# Patient Record
Sex: Male | Born: 1955 | Race: White | Hispanic: No | Smoking: Former smoker
Health system: Southern US, Community
[De-identification: ages and names within clinical notes are randomized; demographics above are authoritative.]

## PROBLEM LIST (undated history)

## (undated) DIAGNOSIS — E119 Type 2 diabetes mellitus without complications: Secondary | ICD-10-CM

## (undated) DIAGNOSIS — G4730 Sleep apnea, unspecified: Secondary | ICD-10-CM

## (undated) DIAGNOSIS — I10 Essential (primary) hypertension: Secondary | ICD-10-CM

## (undated) DIAGNOSIS — N2889 Other specified disorders of kidney and ureter: Secondary | ICD-10-CM

## (undated) HISTORY — PX: NO PAST SURGERIES: SHX2092

---

## 2015-11-28 ENCOUNTER — Other Ambulatory Visit: Payer: Self-pay | Admitting: Urology

## 2016-01-03 ENCOUNTER — Encounter (HOSPITAL_COMMUNITY): Payer: Self-pay

## 2016-01-03 ENCOUNTER — Encounter (HOSPITAL_COMMUNITY): Payer: Self-pay | Admitting: *Deleted

## 2016-01-03 ENCOUNTER — Encounter (HOSPITAL_COMMUNITY)
Admission: RE | Admit: 2016-01-03 | Discharge: 2016-01-03 | Disposition: A | Discharge status: Home / Self Care | Payer: Commercial Managed Care - PPO | Source: Ambulatory Visit | Attending: Urology | Admitting: Urology

## 2016-01-03 DIAGNOSIS — E119 Type 2 diabetes mellitus without complications: Secondary | ICD-10-CM | POA: Diagnosis not present

## 2016-01-03 DIAGNOSIS — Z791 Long term (current) use of non-steroidal anti-inflammatories (NSAID): Secondary | ICD-10-CM | POA: Diagnosis not present

## 2016-01-03 DIAGNOSIS — Z79899 Other long term (current) drug therapy: Secondary | ICD-10-CM | POA: Diagnosis not present

## 2016-01-03 DIAGNOSIS — G4730 Sleep apnea, unspecified: Secondary | ICD-10-CM | POA: Diagnosis not present

## 2016-01-03 DIAGNOSIS — Z87891 Personal history of nicotine dependence: Secondary | ICD-10-CM | POA: Diagnosis not present

## 2016-01-03 DIAGNOSIS — I10 Essential (primary) hypertension: Secondary | ICD-10-CM | POA: Diagnosis not present

## 2016-01-03 DIAGNOSIS — C642 Malignant neoplasm of left kidney, except renal pelvis: Secondary | ICD-10-CM | POA: Diagnosis present

## 2016-01-03 DIAGNOSIS — Z7982 Long term (current) use of aspirin: Secondary | ICD-10-CM | POA: Diagnosis not present

## 2016-01-03 HISTORY — DX: Other specified disorders of kidney and ureter: N28.89

## 2016-01-03 HISTORY — DX: Type 2 diabetes mellitus without complications: E11.9

## 2016-01-03 LAB — BASIC METABOLIC PANEL
Anion gap: 7 (ref 5–15)
BUN: 15 mg/dL (ref 6–20)
CALCIUM: 9.2 mg/dL (ref 8.9–10.3)
CO2: 28 mmol/L (ref 22–32)
Chloride: 105 mmol/L (ref 101–111)
Creatinine, Ser: 0.88 mg/dL (ref 0.61–1.24)
GFR calc Af Amer: >60 mL/min (ref >60)
GFR calc non Af Amer: >60 mL/min (ref >60)
GLUCOSE: 131 mg/dL — AB (ref 65–99)
Potassium: 3.5 mmol/L (ref 3.5–5.1)
Sodium: 140 mmol/L (ref 135–145)

## 2016-01-03 LAB — CBC
HEMATOCRIT: 44.4 % (ref 39.0–52.0)
Hemoglobin: 15.4 g/dL (ref 13.0–17.0)
MCH: 30.7 pg (ref 26.0–34.0)
MCHC: 34.7 g/dL (ref 30.0–36.0)
MCV: 88.6 fL (ref 78.0–100.0)
Platelets: 298 10*3/uL (ref 150–400)
RBC: 5.01 MIL/uL (ref 4.22–5.81)
RDW: 13.8 % (ref 11.5–15.5)
WBC: 9.3 10*3/uL (ref 4.0–10.5)

## 2016-01-03 NOTE — Patient Instructions (Signed)
Gavin Miller  01/03/2016   Your procedure is scheduled on: 01/06/16  Report to [facility]  Entrance take [facility]  elevators to 3rd floor to  [place] at 5:30 AM.  Call this number if you have problems the morning of surgery [phone]   Remember: ONLY 1 PERSON MAY GO WITH YOU TO SHORT STAY TO GET  READY MORNING OF Gavin Miller.  Do not eat food or drink liquids :After Midnight Thursday     Take these medicines the morning of surgery with A SIP OF WATER: none                               You may not have any metal on your body including and              piercings.  Do not wear jewelry, lotions, powders.                        Men may shave face and neck.   Do not bring valuables to the hospital. [place].  Contacts, dentures or bridgework may not be worn into surgery.  Leave suitcase in the car. After surgery it may be brought to your room.     _____________________________________________________________________             [facility] - Preparing for Surgery Before surgery, you can play an important role.  Because skin is not sterile, your skin needs to be as free of germs as possible.  You can reduce the number of germs on your skin by washing with CHG (chlorahexidine gluconate) soap before surgery.  CHG is an antiseptic cleaner which kills germs and bonds with the skin to continue killing germs even after washing. Please DO NOT use if you have an allergy to CHG or antibacterial soaps.  If your skin becomes reddened/irritated stop using the CHG and inform your nurse when you arrive at Short Stay. Do not shave (including legs and underarms) for at least 48 hours prior to the first CHG shower.  You may shave your face/neck. Please follow these instructions carefully:  1.  Shower with CHG Soap the night before surgery and the  morning of Surgery.  2.  If you choose to wash your hair, wash  your hair first as usual with your  normal  shampoo.  3.  After you shampoo, rinse your hair and body thoroughly to remove the  shampoo.                           4.  Use CHG as you would any other liquid soap.  You can apply chg directly  to the skin and wash                       Gently with a scrungie or clean washcloth.  5.  Apply the CHG Soap to your body ONLY FROM THE NECK DOWN.   Do not use on face/ open                           Wound or open sores. Avoid  contact with eyes, ears mouth and genitals (private parts).                       Wash face,  Genitals (private parts) with your normal soap.             6.  Wash thoroughly, paying special attention to the area where your surgery  will be performed.  7.  Thoroughly rinse your body with warm water from the neck down.  8.  DO NOT shower/wash with your normal soap after using and rinsing off  the CHG Soap.                9.  Pat yourself dry with a clean towel.            10.  Wear clean pajamas.            11.  Place clean sheets on your bed the night of your first shower and do not  sleep with pets. Day of Surgery : Do not apply any lotions/deodorants the morning of surgery.  Please wear clean clothes to the hospital/surgery center.  FAILURE TO FOLLOW THESE INSTRUCTIONS MAY RESULT IN THE CANCELLATION OF YOUR SURGERY PATIENT SIGNATURE_________________________________  NURSE SIGNATURE__________________________________  ________________________________________________________________________

## 2016-01-03 NOTE — Pre-Procedure Instructions (Signed)
[facility] faxed Hgb A1C and result is on chart.

## 2016-01-03 NOTE — Pre-Procedure Instructions (Signed)
LMOM for med records at [facility] in [place]- requested most recent hgb A1C result.

## 2016-01-04 LAB — ABO/RH: ABO/RH(D): A NEG

## 2016-01-06 ENCOUNTER — Encounter (HOSPITAL_COMMUNITY)
Admission: RE | Disposition: A | Discharge status: Home / Self Care | Payer: Self-pay | Source: Ambulatory Visit | Attending: Urology

## 2016-01-06 ENCOUNTER — Observation Stay (HOSPITAL_COMMUNITY)
Admission: RE | Admit: 2016-01-06 | Discharge: 2016-01-07 | Disposition: A | Discharge status: Home / Self Care | Payer: Commercial Managed Care - PPO | Source: Ambulatory Visit | Attending: Urology | Admitting: Urology

## 2016-01-06 ENCOUNTER — Encounter (HOSPITAL_COMMUNITY): Payer: Self-pay | Admitting: *Deleted

## 2016-01-06 ENCOUNTER — Inpatient Hospital Stay (HOSPITAL_COMMUNITY): Payer: Commercial Managed Care - PPO | Admitting: Anesthesiology

## 2016-01-06 DIAGNOSIS — G4730 Sleep apnea, unspecified: Secondary | ICD-10-CM | POA: Insufficient documentation

## 2016-01-06 DIAGNOSIS — Z79899 Other long term (current) drug therapy: Secondary | ICD-10-CM | POA: Insufficient documentation

## 2016-01-06 DIAGNOSIS — C642 Malignant neoplasm of left kidney, except renal pelvis: Secondary | ICD-10-CM | POA: Diagnosis not present

## 2016-01-06 DIAGNOSIS — Z791 Long term (current) use of non-steroidal anti-inflammatories (NSAID): Secondary | ICD-10-CM | POA: Insufficient documentation

## 2016-01-06 DIAGNOSIS — I10 Essential (primary) hypertension: Secondary | ICD-10-CM | POA: Insufficient documentation

## 2016-01-06 DIAGNOSIS — N2889 Other specified disorders of kidney and ureter: Secondary | ICD-10-CM | POA: Diagnosis present

## 2016-01-06 DIAGNOSIS — Z87891 Personal history of nicotine dependence: Secondary | ICD-10-CM | POA: Insufficient documentation

## 2016-01-06 DIAGNOSIS — E119 Type 2 diabetes mellitus without complications: Secondary | ICD-10-CM | POA: Insufficient documentation

## 2016-01-06 DIAGNOSIS — Z7982 Long term (current) use of aspirin: Secondary | ICD-10-CM | POA: Insufficient documentation

## 2016-01-06 HISTORY — DX: Essential (primary) hypertension: I10

## 2016-01-06 HISTORY — PX: ROBOT ASSISTED LAPAROSCOPIC NEPHRECTOMY: SHX5140

## 2016-01-06 HISTORY — DX: Sleep apnea, unspecified: G47.30

## 2016-01-06 LAB — HEMOGLOBIN AND HEMATOCRIT, BLOOD
HEMATOCRIT: 45.5 % (ref 39.0–52.0)
Hemoglobin: 15.6 g/dL (ref 13.0–17.0)

## 2016-01-06 LAB — BASIC METABOLIC PANEL
Anion gap: 9 (ref 5–15)
BUN: 20 mg/dL (ref 6–20)
CALCIUM: 8.7 mg/dL — AB (ref 8.9–10.3)
CO2: 26 mmol/L (ref 22–32)
CREATININE: 1.15 mg/dL (ref 0.61–1.24)
Chloride: 105 mmol/L (ref 101–111)
GFR calc Af Amer: >60 mL/min (ref >60)
Glucose, Bld: 166 mg/dL — ABNORMAL HIGH (ref 65–99)
Potassium: 3.8 mmol/L (ref 3.5–5.1)
SODIUM: 140 mmol/L (ref 135–145)

## 2016-01-06 LAB — TYPE AND SCREEN
ABO/RH(D): A NEG
Antibody Screen: NEGATIVE

## 2016-01-06 LAB — GLUCOSE, CAPILLARY: GLUCOSE-CAPILLARY: 116 mg/dL — AB (ref 65–99)

## 2016-01-06 SURGERY — NEPHRECTOMY, RADICAL, ROBOT-ASSISTED, LAPAROSCOPIC, ADULT
Anesthesia: General | Laterality: Left

## 2016-01-06 MED ORDER — SUGAMMADEX SODIUM 200 MG/2ML IV SOLN
INTRAVENOUS | Status: DC | PRN
  Administered 2016-01-06: 200 mg via INTRAVENOUS

## 2016-01-06 MED ORDER — OXYCODONE HCL 5 MG PO TABS: 10.0000 mg | ORAL_TABLET | ORAL | Status: DC | PRN

## 2016-01-06 MED ORDER — LACTATED RINGERS IR SOLN
Status: DC | PRN
  Administered 2016-01-06: 1000 mL

## 2016-01-06 MED ORDER — HYDROMORPHONE HCL 1 MG/ML IJ SOLN
INTRAMUSCULAR | Status: DC | PRN
  Administered 2016-01-06 (×3): 0.5 mg via INTRAVENOUS

## 2016-01-06 MED ORDER — OXYCODONE HCL 5 MG PO TABS: 5.0000 mg | ORAL_TABLET | Freq: Once | ORAL | Status: DC | PRN

## 2016-01-06 MED ORDER — PROPOFOL 10 MG/ML IV BOLUS
INTRAVENOUS | Status: AC
  Filled 2016-01-06: qty 20

## 2016-01-06 MED ORDER — LIDOCAINE HCL (CARDIAC) 20 MG/ML IV SOLN
INTRAVENOUS | Status: AC
  Filled 2016-01-06: qty 5

## 2016-01-06 MED ORDER — MIDAZOLAM HCL 5 MG/5ML IJ SOLN
INTRAMUSCULAR | Status: DC | PRN
  Administered 2016-01-06: 2 mg via INTRAVENOUS

## 2016-01-06 MED ORDER — HYDRALAZINE HCL 20 MG/ML IJ SOLN
INTRAMUSCULAR | Status: AC
  Filled 2016-01-06: qty 1

## 2016-01-06 MED ORDER — HYDROMORPHONE HCL 1 MG/ML IJ SOLN
INTRAMUSCULAR | Status: AC
  Filled 2016-01-06: qty 1

## 2016-01-06 MED ORDER — LABETALOL HCL 5 MG/ML IV SOLN
INTRAVENOUS | Status: AC
  Filled 2016-01-06: qty 4

## 2016-01-06 MED ORDER — HYDROMORPHONE HCL 1 MG/ML IJ SOLN
0.2500 mg | INTRAMUSCULAR | Status: DC | PRN
  Administered 2016-01-06 (×3): 0.5 mg via INTRAVENOUS

## 2016-01-06 MED ORDER — OXYCODONE HCL 5 MG/5ML PO SOLN
5.0000 mg | Freq: Once | ORAL | Status: DC | PRN
  Filled 2016-01-06: qty 5

## 2016-01-06 MED ORDER — ONDANSETRON HCL 4 MG/2ML IJ SOLN
INTRAMUSCULAR | Status: DC | PRN
  Administered 2016-01-06: 4 mg via INTRAVENOUS

## 2016-01-06 MED ORDER — STERILE WATER FOR IRRIGATION IR SOLN
Status: DC | PRN
  Administered 2016-01-06: 1000 mL

## 2016-01-06 MED ORDER — LACTATED RINGERS IV SOLN
INTRAVENOUS | Status: DC
  Administered 2016-01-06: 23:00:00 via INTRAVENOUS

## 2016-01-06 MED ORDER — DOCUSATE SODIUM 100 MG PO CAPS
100.0000 mg | ORAL_CAPSULE | Freq: Two times a day (BID) | ORAL | Status: DC
  Administered 2016-01-06: 100 mg via ORAL
  Filled 2016-01-06: qty 1

## 2016-01-06 MED ORDER — OXYCODONE HCL 5 MG PO TABS: ORAL_TABLET | ORAL | 0 refills | Status: AC

## 2016-01-06 MED ORDER — BUPIVACAINE LIPOSOME 1.3 % IJ SUSP
20.0000 mL | Freq: Once | INTRAMUSCULAR | Status: DC
  Filled 2016-01-06: qty 20

## 2016-01-06 MED ORDER — ROCURONIUM BROMIDE 100 MG/10ML IV SOLN
INTRAVENOUS | Status: DC | PRN
  Administered 2016-01-06: 50 mg via INTRAVENOUS
  Administered 2016-01-06 (×2): 10 mg via INTRAVENOUS

## 2016-01-06 MED ORDER — OXYCODONE HCL 5 MG PO TABS: 5.0000 mg | ORAL_TABLET | ORAL | Status: DC | PRN

## 2016-01-06 MED ORDER — SODIUM CHLORIDE 0.9 % IJ SOLN
INTRAMUSCULAR | Status: AC
  Filled 2016-01-06: qty 50

## 2016-01-06 MED ORDER — FENTANYL CITRATE (PF) 250 MCG/5ML IJ SOLN
INTRAMUSCULAR | Status: AC
  Filled 2016-01-06: qty 5

## 2016-01-06 MED ORDER — SUCCINYLCHOLINE CHLORIDE 20 MG/ML IJ SOLN
INTRAMUSCULAR | Status: DC | PRN
  Administered 2016-01-06: 180 mg via INTRAVENOUS

## 2016-01-06 MED ORDER — MIDAZOLAM HCL 2 MG/2ML IJ SOLN
INTRAMUSCULAR | Status: AC
  Filled 2016-01-06: qty 2

## 2016-01-06 MED ORDER — HYDROMORPHONE HCL 1 MG/ML IJ SOLN
0.5000 mg | INTRAMUSCULAR | Status: DC | PRN
  Administered 2016-01-06 (×2): 1 mg via INTRAVENOUS
  Filled 2016-01-06 (×2): qty 1

## 2016-01-06 MED ORDER — FENTANYL CITRATE (PF) 100 MCG/2ML IJ SOLN
INTRAMUSCULAR | Status: DC | PRN
  Administered 2016-01-06: 50 ug via INTRAVENOUS
  Administered 2016-01-06: 100 ug via INTRAVENOUS
  Administered 2016-01-06 (×2): 50 ug via INTRAVENOUS

## 2016-01-06 MED ORDER — BUPIVACAINE LIPOSOME 1.3 % IJ SUSP
INTRAMUSCULAR | Status: DC | PRN
  Administered 2016-01-06: 20 mL

## 2016-01-06 MED ORDER — SODIUM CHLORIDE 0.9 % IJ SOLN
INTRAMUSCULAR | Status: AC
  Filled 2016-01-06: qty 10

## 2016-01-06 MED ORDER — ONDANSETRON HCL 4 MG/2ML IJ SOLN
4.0000 mg | INTRAMUSCULAR | Status: DC | PRN
Start: 2016-01-06 — End: 2016-01-07
  Administered 2016-01-06: 4 mg via INTRAVENOUS
  Filled 2016-01-06: qty 2

## 2016-01-06 MED ORDER — LIDOCAINE HCL (CARDIAC) 20 MG/ML IV SOLN
INTRAVENOUS | Status: DC | PRN
  Administered 2016-01-06: 50 mg via INTRAVENOUS

## 2016-01-06 MED ORDER — CEFAZOLIN SODIUM-DEXTROSE 2-4 GM/100ML-% IV SOLN
INTRAVENOUS | Status: AC
  Filled 2016-01-06: qty 100

## 2016-01-06 MED ORDER — ACETAMINOPHEN 500 MG PO TABS
1000.0000 mg | ORAL_TABLET | Freq: Four times a day (QID) | ORAL | Status: DC
  Administered 2016-01-06 – 2016-01-07 (×2): 1000 mg via ORAL
  Filled 2016-01-06 (×3): qty 2

## 2016-01-06 MED ORDER — LABETALOL HCL 5 MG/ML IV SOLN
INTRAVENOUS | Status: DC | PRN
  Administered 2016-01-06: 5 mg via INTRAVENOUS

## 2016-01-06 MED ORDER — LACTATED RINGERS IV SOLN
INTRAVENOUS | Status: DC | PRN
  Administered 2016-01-06: 07:00:00 via INTRAVENOUS

## 2016-01-06 MED ORDER — ROCURONIUM BROMIDE 100 MG/10ML IV SOLN
INTRAVENOUS | Status: AC
  Filled 2016-01-06: qty 2

## 2016-01-06 MED ORDER — HYDROMORPHONE HCL 2 MG/ML IJ SOLN
INTRAMUSCULAR | Status: AC
  Filled 2016-01-06: qty 1

## 2016-01-06 MED ORDER — PROPOFOL 10 MG/ML IV BOLUS
INTRAVENOUS | Status: DC | PRN
  Administered 2016-01-06: 200 mg via INTRAVENOUS

## 2016-01-06 MED ORDER — ONDANSETRON HCL 4 MG/2ML IJ SOLN: 4.0000 mg | Freq: Four times a day (QID) | INTRAMUSCULAR | Status: DC | PRN

## 2016-01-06 MED ORDER — CEFAZOLIN SODIUM-DEXTROSE 2-4 GM/100ML-% IV SOLN
2.0000 g | INTRAVENOUS | Status: AC
  Administered 2016-01-06: 2 g via INTRAVENOUS

## 2016-01-06 SURGICAL SUPPLY — 56 items
BAG LAPAROSCOPIC 12 15 PORT 16 (BASKET) ×1 IMPLANT
BAG RETRIEVAL 12/15 (BASKET) ×2
BAG RETRIEVAL 12/15MM (BASKET) ×1
CHLORAPREP W/TINT 26ML (MISCELLANEOUS) ×3 IMPLANT
CLIP LIGATING HEM O LOK PURPLE (MISCELLANEOUS) ×3 IMPLANT
CLIP LIGATING HEMO LOK XL GOLD (MISCELLANEOUS) ×3 IMPLANT
CLIP LIGATING HEMO O LOK GREEN (MISCELLANEOUS) ×3 IMPLANT
COVER TIP SHEARS 8 DVNC (MISCELLANEOUS) ×1 IMPLANT
COVER TIP SHEARS 8MM DA VINCI (MISCELLANEOUS) ×2
DECANTER SPIKE VIAL GLASS SM (MISCELLANEOUS) IMPLANT
DRAIN CHANNEL 15F RND FF 3/16 (WOUND CARE) IMPLANT
DRAPE ARM DVNC X/XI (DISPOSABLE) ×4 IMPLANT
DRAPE COLUMN DVNC XI (DISPOSABLE) ×1 IMPLANT
DRAPE DA VINCI XI ARM (DISPOSABLE) ×8
DRAPE DA VINCI XI COLUMN (DISPOSABLE) ×2
DRAPE INCISE IOBAN 66X45 STRL (DRAPES) ×3 IMPLANT
DRAPE LAPAROSCOPIC ABDOMINAL (DRAPES) ×3 IMPLANT
DRAPE SHEET LG 3/4 BI-LAMINATE (DRAPES) ×3 IMPLANT
ELECT PENCIL ROCKER SW 15FT (MISCELLANEOUS) ×3 IMPLANT
ELECT REM PT RETURN 9FT ADLT (ELECTROSURGICAL) ×3
ELECTRODE REM PT RTRN 9FT ADLT (ELECTROSURGICAL) ×1 IMPLANT
EVACUATOR SILICONE 100CC (DRAIN) IMPLANT
GLOVE BIO SURGEON STRL SZ 6.5 (GLOVE) ×8 IMPLANT
GLOVE BIO SURGEONS STRL SZ 6.5 (GLOVE) ×4
GLOVE BIOGEL M STRL SZ7.5 (GLOVE) ×9 IMPLANT
GOWN STRL REUS W/TWL LRG LVL3 (GOWN DISPOSABLE) ×9 IMPLANT
IRRIG SUCT STRYKERFLOW 2 WTIP (MISCELLANEOUS)
IRRIGATION SUCT STRKRFLW 2 WTP (MISCELLANEOUS) IMPLANT
KIT BASIN OR (CUSTOM PROCEDURE TRAY) ×3 IMPLANT
LIQUID BAND (GAUZE/BANDAGES/DRESSINGS) ×3 IMPLANT
LOOP VESSEL MAXI BLUE (MISCELLANEOUS) ×3 IMPLANT
NEEDLE INSUFFLATION 14GA 120MM (NEEDLE) ×3 IMPLANT
PORT ACCESS TROCAR AIRSEAL 12 (TROCAR) ×1 IMPLANT
PORT ACCESS TROCAR AIRSEAL 5M (TROCAR) ×2
RELOAD STAPLER WHITE 60MM (STAPLE) ×1 IMPLANT
SEAL CANN UNIV 5-8 DVNC XI (MISCELLANEOUS) ×4 IMPLANT
SEAL XI 5MM-8MM UNIVERSAL (MISCELLANEOUS) ×8
SET TRI-LUMEN FLTR TB AIRSEAL (TUBING) ×3 IMPLANT
SOLUTION ELECTROLUBE (MISCELLANEOUS) ×3 IMPLANT
SPONGE LAP 4X18 X RAY DECT (DISPOSABLE) ×3 IMPLANT
STAPLE ECHEON FLEX 60 POW ENDO (STAPLE) ×3 IMPLANT
STAPLER RELOAD WHITE 60MM (STAPLE) ×3
SUT ETHILON 3 0 PS 1 (SUTURE) IMPLANT
SUT MNCRL AB 4-0 PS2 18 (SUTURE) ×6 IMPLANT
SUT PDS AB 1 CT1 27 (SUTURE) ×9 IMPLANT
SUT VICRYL 0 UR6 27IN ABS (SUTURE) IMPLANT
TAPE STRIPS DRAPE STRL (GAUZE/BANDAGES/DRESSINGS) IMPLANT
TOWEL OR NON WOVEN STRL DISP B (DISPOSABLE) ×6 IMPLANT
TRAY FOLEY W/METER SILVER 14FR (SET/KITS/TRAYS/PACK) IMPLANT
TRAY FOLEY W/METER SILVER 16FR (SET/KITS/TRAYS/PACK) ×3 IMPLANT
TRAY LAPAROSCOPIC (CUSTOM PROCEDURE TRAY) ×3 IMPLANT
TROCAR BLADELESS OPT 12M 100M (ENDOMECHANICALS) ×3 IMPLANT
TROCAR BLADELESS OPT 5 100 (ENDOMECHANICALS) IMPLANT
TROCAR UNIVERSAL OPT 12M 100M (ENDOMECHANICALS) ×3 IMPLANT
TROCAR XCEL 12X100 BLDLESS (ENDOMECHANICALS) ×3 IMPLANT
WATER STERILE IRR 1500ML POUR (IV SOLUTION) IMPLANT

## 2016-01-06 NOTE — Discharge Instructions (Signed)

## 2016-01-06 NOTE — Anesthesia Postprocedure Evaluation (Signed)
Anesthesia Post Note  Patient: Gavin Miller  Procedure(s) Performed: Procedure(s) (LRB): XI ROBOTIC ASSISTED LAPAROSCOPIC NEPHRECTOMY (Left)  Patient location during evaluation: PACU Anesthesia Type: General Level of consciousness: awake and alert and patient cooperative Pain management: pain level controlled Vital Signs Assessment: post-procedure vital signs reviewed and stable Respiratory status: spontaneous breathing and respiratory function stable Cardiovascular status: stable Anesthetic complications: no    Last Vitals:  Vitals:   01/06/16 1115 01/06/16 1130  BP: (!) 145/84 (!) 144/87  Pulse: 98 98  Resp: 16 20  Temp:      Last Pain:  Vitals:   01/06/16 1130  TempSrc:   PainSc: [place]

## 2016-01-06 NOTE — Transfer of Care (Signed)
Immediate Anesthesia Transfer of Care Note  Patient: Gavin Miller  Procedure(s) Performed: Procedure(s): XI ROBOTIC ASSISTED LAPAROSCOPIC NEPHRECTOMY (Left)  Patient Location: PACU  Anesthesia Type:General  Level of Consciousness:  sedated, patient cooperative and responds to stimulation  Airway & Oxygen Therapy:Patient Spontanous Breathing and Patient connected to face mask oxgen  Post-op Assessment:  Report given to PACU RN and Post -op Vital signs reviewed and stable  Post vital signs:  Reviewed and stable  Last Vitals:  Vitals:   01/06/16 0546  BP: 140/90  Pulse: 90  Resp: 16  Temp: [id number] C    Complications: No apparent anesthesia complications

## 2016-01-06 NOTE — Anesthesia Procedure Notes (Addendum)

## 2016-01-06 NOTE — Discharge Summary (Signed)
Date of admission: 01/06/2016  Date of discharge: 01/07/2016  Admission diagnosis: left renal mass  Discharge diagnosis: left renal mass  Secondary diagnoses: same  History and Physical: For full details, please see admission history and physical. Briefly, Gavin Miller is a 60 y.o. year old patient with left renal mass  Hospital Course: 60 yo male who is s/p left robotic radical nephrectomy with Dr. Tresa Moore. He did well post-operatively. His diet was slowly advanced and at the time of discharge he was tolerating a regular diet, ambulating at his baseline, was voiding spontaneously after foley catheter removal, and pain was well controlled with oral narcotics. He was discharged to home on POD#1.  Laboratory values:   Recent Labs  01/06/16 1044 01/07/16 0529  HGB 15.6 13.7  HCT 45.5 41.8    Recent Labs  01/06/16 1044 01/07/16 0529  CREATININE 1.15 1.33*    Disposition: Home  Discharge instruction:  Discharge Instructions    Discharge instructions    Complete by:  As directed   Activity:  You are encouraged to ambulate frequently (about every hour during waking hours) to help prevent blood clots from forming in your legs or lungs.  However, you should not engage in any heavy lifting (> 10-15 lbs), strenuous activity, or straining. Diet: You should advance your diet as instructed by your physician.  It will be normal to have some bloating, nausea, and abdominal discomfort intermittently. Prescriptions:  You will be provided a prescription for pain medication to take as needed.  If your pain is not severe enough to require the prescription pain medication, you may take extra strength Tylenol instead which will have less side effects.  You should also take a prescribed stool softener to avoid straining with bowel movements as the prescription pain medication may constipate you. Incisions: You may remove your dressing bandages 48 hours after surgery if not removed in the hospital.  You  will either have some small staples or special tissue glue at each of the incision sites. Once the bandages are removed (if present), the incisions may stay open to air.  You may start showering (but not soaking or bathing in water) the 2nd day after surgery and the incisions simply need to be patted dry after the shower.  No additional care is needed. What to call us about: You should call the office [phone]) if you develop fever > 101 or develop persistent vomiting.      Discharge medications:    Medication List    TAKE these medications   aspirin 81 MG tablet Take 81 mg by mouth daily.   celecoxib 200 MG capsule Commonly known as:  CELEBREX Take 400 mg by mouth daily.   lisinopril-hydrochlorothiazide 20-12.5 MG tablet Commonly known as:  PRINZIDE,ZESTORETIC Take 1 tablet by mouth daily.   oxyCODONE 5 MG immediate release tablet Commonly known as:  ROXICODONE Take 1-2 tablets every 4-6 hours as needed for pain.       Followup:  Follow-up Information    Alexis Frock, MD On 01/20/2016.   Specialty:  Urology Why:  at 1:45 for MD visit. Dr. Tresa Moore will call you with pathology results prior when available.  Contact information: [address] [place] [address] [phone]

## 2016-01-06 NOTE — H&P (Signed)
Gavin Miller is an 60 y.o. male.    Chief Complaint: Pre-op LEFT robotic radical nephrectomy  HPI:   1 - Kidney Cancer - 6cm Left lower pole solid, heterogenous mass incidental on spine MRI 2017. Mass abuts hilar fat. 1 artery / 2 vein (large lumbar off lower pole branch) renovascular anatomy. Numerous parasitic vessels. No chest adenopathy. 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 Lesion not present on CT 2008.  Cr <0.9 2017.    PMH sig for DM2 (A1c <7), Lumbago, TNA. His PCP is Gavin Litten MD with [facility]   Today "Gavin Miller" is seen to proceed with LEFT radical nephrectomy for his likley kidney cancer.     Past Medical History:  Diagnosis Date  . Diabetes mellitus without complication ([place])    no meds yet- will begin Metformin after surgery  . Hypertension   . Kidney mass   . Sleep apnea     Past Surgical History:  Procedure Laterality Date  . NO PAST SURGERIES      History reviewed. No pertinent family history. Social History:  reports that he quit smoking about 34 years ago. His smoking use included Cigarettes. He has never used smokeless tobacco. He reports that he does not drink alcohol or use drugs.  Allergies: No Known Allergies  Medications Prior to Admission  Medication Sig Dispense Refill  . aspirin 81 MG tablet Take 81 mg by mouth daily.    . celecoxib (CELEBREX) 200 MG capsule Take 400 mg by mouth daily.    Marland Kitchen lisinopril-hydrochlorothiazide (PRINZIDE,ZESTORETIC) 20-12.5 MG tablet Take 1 tablet by mouth daily.       Results for orders placed or performed during the hospital encounter of 01/06/16 (from the past 48 hour(s))  Glucose, capillary     Status: Abnormal   Collection Time: 01/06/16  5:51 AM  Result Value Ref Range   Glucose-Capillary 116 (H) 65 - 99 mg/dL   Comment 1 Notify RN    No results found.  Review of Systems  Constitutional: Negative.  Negative for chills and fever.  HENT: Negative.   Eyes: Negative.   Respiratory:  Negative.   Cardiovascular: Negative.   Gastrointestinal: Negative.   Genitourinary: Negative.   Musculoskeletal: Positive for back pain.  Skin: Negative.   Neurological: Negative.   Endo/Heme/Allergies: Negative.   Psychiatric/Behavioral: Negative.     Blood pressure 140/90, pulse 90, temperature 97.5 F (36.4 C), temperature source Oral, resp. rate 16, SpO2 97 %. Physical Exam  Constitutional: He appears well-developed.  HENT:  Head: Normocephalic.  Eyes: Pupils are equal, round, and reactive to light.  Neck: Normal range of motion.  Cardiovascular: Normal rate.   Respiratory: Effort normal.  GI: Soft.  Genitourinary:  Genitourinary Comments: No CVAT  Musculoskeletal: Normal range of motion.  Neurological: He is alert.  Skin: Skin is warm.  Psychiatric: He has a normal mood and affect. His behavior is normal. Judgment and thought content normal.     Assessment/Plan  1 - Kidney Cancer - Proceed as planned with LEFT radical nephrectomy. Risks, benefits, expected peri-op course reiterated.   Alexis Frock, MD 01/06/2016, 6:03 AM

## 2016-01-06 NOTE — Anesthesia Preprocedure Evaluation (Signed)
Anesthesia Evaluation  Patient identified by MRN, date of birth, ID band Patient awake    Reviewed: Allergy & Precautions, NPO status , Patient's Chart, lab work & pertinent test results  Airway Mallampati: II   Neck ROM: full    Dental   Pulmonary sleep apnea , former smoker,    breath sounds clear to auscultation       Cardiovascular hypertension,  Rhythm:regular Rate:Normal     Neuro/Psych    GI/Hepatic   Endo/Other  diabetes, Type 2  Renal/GU Renal mass     Musculoskeletal   Abdominal   Peds  Hematology   Anesthesia Other Findings   Reproductive/Obstetrics                             Anesthesia Physical Anesthesia Plan  ASA: II  Anesthesia Plan: General   Post-op Pain Management:    Induction: Intravenous  Airway Management Planned: Oral ETT  Additional Equipment:   Intra-op Plan:   Post-operative Plan: Extubation in OR  Informed Consent: I have reviewed the patients History and Physical, chart, labs and discussed the procedure including the risks, benefits and alternatives for the proposed anesthesia with the patient or authorized representative who has indicated his/her understanding and acceptance.     Plan Discussed with: CRNA, Anesthesiologist and Surgeon  Anesthesia Plan Comments:         Anesthesia Quick Evaluation

## 2016-01-06 NOTE — Progress Notes (Signed)
Day of Surgery  Post op Check   Subjective: The patient is doing well.  Slight intermittent nausea without vomiting. Pain is adequately controlled. PACU labs stable.   Objective: Vital signs in last 24 hours: Temp:  [97.5 F (36.4 C)-98.3 F (36.8 C)] 98.3 F (36.8 C) (07/28 1155) Pulse Rate:  [90-98] 98 (07/28 1155) Resp:  [10-20] 16 (07/28 1146) BP: (137-157)/(84-99) 137/84 (07/28 1155) SpO2:  [95 %-100 %] 98 % (07/28 1155) Weight:  [93.9 kg (207 lb)] 93.9 kg (207 lb) (07/28 1155)  Intake/Output from previous day: No intake/output data recorded. Intake/Output this shift: Total I/O In: 1700 [I.V.:1300; Other:400] Out: 175 [Urine:150; Blood:25]  Physical Exam:  General: Alert and oriented. CV: RRR Lungs: normal work of breathing on nasal cannula GI: Soft, Nondistended. Incisions: Clean and dry. Urine: Clear Extremities: Nontender, no erythema, no edema.  Lab Results:  Recent Labs  01/06/16 1044  HGB 15.6  HCT 45.5          Recent Labs  01/03/16 1400 01/06/16 1044  CREATININE 0.88 1.15           Results for orders placed or performed during the hospital encounter of 01/06/16 (from the past 24 hour(s))  Glucose, capillary     Status: Abnormal   Collection Time: 01/06/16  5:51 AM  Result Value Ref Range   Glucose-Capillary 116 (H) 65 - 99 mg/dL   Comment 1 Notify RN   Hemoglobin and hematocrit, blood     Status: None   Collection Time: 01/06/16 10:44 AM  Result Value Ref Range   Hemoglobin 15.6 13.0 - 17.0 g/dL   HCT 45.5 39.0 - [id number] %  Basic metabolic panel     Status: Abnormal   Collection Time: 01/06/16 10:44 AM  Result Value Ref Range   Sodium 140 135 - 145 mmol/L   Potassium 3.8 3.5 - 5.1 mmol/L   Chloride 105 101 - 111 mmol/L   CO2 26 22 - 32 mmol/L   Glucose, Bld 166 (H) 65 - 99 mg/dL   BUN 20 6 - 20 mg/dL   Creatinine, Ser 1.15 0.61 - 1.24 mg/dL   Calcium 8.7 (L) 8.9 - 10.3 mg/dL   GFR calc non Af Amer >60 >60 mL/min   GFR calc Af Amer  >60 >60 mL/min   Anion gap 9 5 - 15    Assessment/Plan: POD# 0 s/p robotic left radical nephrectomy doing well.  1) Ambulate, Incentive spirometry 2) Advance diet as tolerated   LOS: 1 day   Lolita Rieger 01/06/2016, 4:32 PM     I have seen and examined the patient and agree with above.  Briefly,  S: POD 0 following nephrectomy Pain controlled  O: NAD Surgical sites c/d/i. No hematomas. Foley c/d/i  A/P Doing well POD 0. Goals for discharge discussed. Likely DC POD1 or 2 pending his progress.

## 2016-01-06 NOTE — Brief Op Note (Signed)
01/06/2016  10:13 AM  PATIENT:  Gavin Miller  60 y.o. male  PRE-OPERATIVE DIAGNOSIS:  LEFT RENAL MASS  POST-OPERATIVE DIAGNOSIS:  LEFT RENAL MASS  PROCEDURE:  Procedure(s): XI ROBOTIC ASSISTED LAPAROSCOPIC NEPHRECTOMY (Left)  SURGEON:  Surgeon(s) and Role:    * Alexis Frock, MD - Primary  PHYSICIAN ASSISTANT:   ASSISTANTS: none   ANESTHESIA:   general  EBL:  Total I/O In: 1000 [I.V.:1000] Out: 125 [Urine:100; Blood:25]  BLOOD ADMINISTERED:none  DRAINS: 1 - foley to gravity   LOCAL MEDICATIONS USED:  MARCAINE     SPECIMEN:  Source of Specimen:  LEFT radical nephrectomy  DISPOSITION OF SPECIMEN:  PATHOLOGY  COUNTS:  YES  TOURNIQUET:  * No tourniquets in log *  DICTATION: .Other Dictation: Dictation Number [id number]  PLAN OF CARE: Admit to inpatient   PATIENT DISPOSITION:  PACU - hemodynamically stable.   Delay start of Pharmacological VTE agent (>24hrs) due to surgical blood loss or risk of bleeding: yes

## 2016-01-07 DIAGNOSIS — C642 Malignant neoplasm of left kidney, except renal pelvis: Secondary | ICD-10-CM | POA: Diagnosis not present

## 2016-01-07 LAB — BASIC METABOLIC PANEL
Anion gap: 5 (ref 5–15)
BUN: 19 mg/dL (ref 6–20)
CO2: 30 mmol/L (ref 22–32)
CREATININE: 1.33 mg/dL — AB (ref 0.61–1.24)
Calcium: 8.3 mg/dL — ABNORMAL LOW (ref 8.9–10.3)
Chloride: 105 mmol/L (ref 101–111)
GFR calc Af Amer: >60 mL/min (ref >60)
GFR, EST NON AFRICAN AMERICAN: 56 mL/min — AB (ref >60)
GLUCOSE: 111 mg/dL — AB (ref 65–99)
Potassium: 3.8 mmol/L (ref 3.5–5.1)
SODIUM: 140 mmol/L (ref 135–145)

## 2016-01-07 LAB — HEMOGLOBIN AND HEMATOCRIT, BLOOD
HEMATOCRIT: 41.8 % (ref 39.0–52.0)
HEMOGLOBIN: 13.7 g/dL (ref 13.0–17.0)

## 2016-01-07 NOTE — Op Note (Signed)
[name], Miller             ACCOUNT NO.:  [id number]  MEDICAL RECORD NO.:  [id number]  LOCATION:  [id number]                         FACILITY:  [facility]  PHYSICIAN:  Alexis Frock, MD     DATE OF BIRTH:  [birth year]  DATE OF PROCEDURE: 01/06/2016                               OPERATIVE REPORT  DIAGNOSIS:  Large left renal mass.  PROCEDURE:  Robotic-assisted laparoscopic left radical nephrectomy.  ESTIMATED BLOOD LOSS:  Nil.  COMPLICATION:  None.  SPECIMEN:  Left radical nephrectomy.  FINDINGS:  Single artery, single vein, but very early branching vein renovascular anatomy with prominent lumbar vein of lower branch.  INDICATION:  Gavin Miller is a very pleasant 60 year old gentleman, who was found incidentally on spine imaging to have a heterogeneous- enhancing renal mass, dedicated imaging with CT of the chest and abdomen did reveal large left renal mass of solid enhancing but without obvious distant disease, most consistent localized renal cell carcinoma.  Options were discussed for management including surgical extirpation with curative intent versus more palliative options and he wished to proceed with surgery with curative intent with minimally-invasive assistance. Informed consent was obtained and placed in the medical record.  PROCEDURE IN DETAIL:  The patient being Gavin Miller, was verified. Procedure being left radical nephrectomy was confirmed.  Procedure was carried out.  Time-out was performed.  Intravenous antibiotics were administered.  General endotracheal anesthesia was introduced.  Foley catheter was placed per urethra to straight drain.  The patient was placed into a left side up full flank position, employing 15 degrees of stable flexion, superior arm elevator, axillary roll, sequential compression devices and beanbag.  He was further fashioned to the operative table using 3-inch tape over foam padding across his supraxiphoid chest and pelvis.  Sterile  field was created by first clipper shaving and then prepping his entire left flank and abdomen using chlorhexidine gluconate.  Next, a high-flow, low-pressure pneumoperitoneum was obtained using Veress technique in the left lower quadrant having passed the aspiration and drop test.  An 8-mm robotic camera port was then placed and positioned approximately 1 handbreadth superolateral to the umbilicus.  Laparoscopic examination of the peritoneal cavity revealed no significant adhesions and no visceral injury.  Additional ports were then placed as follows:  Left subcostal 8- mm robotic port, left far lateral 8-mm robotic port approximately 4 fingerbreadths superomedial to the anterior superior iliac spine, left paramedian inferior robotic port approximately 1 handbreadth superior to the pubic ramus and two 12-mm assistant ports in the midline, one just above the umbilicus and another 3 fingerbreadths above the level of the camera port.  Robot was docked and passed through the electronic checks. Next, attention was directed at development of retroperitoneum. Incision was made lateral to the descending colon from the area of the splenic flexure towards the area of the internal ring.  The colon was carefully swept medially.  Lateral splenic attachments were taken down allowing the spleen to rotate by gravity superomedially.  The lower pole kidney area was identified within Gerota's, placed on gentle lateral traction.  Dissection was proceeded medial to this towards the area of the psoas musculature.  The ureter and gonadal along with some parasitic vessels  accompanying the gonadal were identified.  Now, this was placed on gentle lateral traction and dissection proceeded within this triangle between the psoas muscle and ureter superiorly towards the area of the renal hilum.  The distal pancreas was encountered as well as the splenic vessels and these were carefully swept medially away from the  kidney. The hilum was encountered and was somewhat complex as anticipated with a dominant lower lumbar vein entering an early branching renal vein that had two components and a single artery.  The lower lumbar vein was controlled using cold clips to allow better window toward the area of the artery.  The artery was Nelda Marseille, it was carefully mobilized circumferentially and controlled using extra-large clip proximally with vascular load stapler distally.  The vein was controlled quite proximally just before its branching using vascular load stapler. Dissection was then proceeded just lateral to the aorta superiorly in a plane keeping the adrenal with the nephrectomy specimen.  The adrenal vessels were encountered and controlled using vascular load stapler. Superior and lateral attachments were taken down using cautery scissors. The gonadal vessels were controlled using cold clips as was the ureter, this completely freed up the quite large left radical nephrectomy specimen.  This was due to its size, it was partially placed within the laparoscopic retrieval bag.  Largest size available.  Robot was then undocked.  Specimen was retrieved by extending the two system port sites, so that connecting the midline using this as an extraction site and the left radical nephrectomy specimen was delivered and set aside for permanent pathology.  The area was inspected and visibly intact. The abdomen was inspected via the extraction site, again no visceral injury.  The swath of omentum was brought over the extraction site, which was then closed at the level of fascia using figure-of-eight PDS x6 followed by interrupted Vicryl at the level of Scarpa's.  All incision sites were infiltrated with dilute lyophilized Marcaine and closed at the level of the skin using subcuticular Monocryl followed by Dermabond and procedure was terminated.  The patient tolerated the procedure well.  There were no immediate  periprocedural complications. The patient was taken to the postanesthesia care unit in stable condition.          ______________________________ Alexis Frock, MD     TM/MEDQ  D:  01/06/2016  T:  01/07/2016  Job:  [id number]

## 2016-01-11 ENCOUNTER — Other Ambulatory Visit: Payer: Self-pay | Admitting: Urology

## 2016-01-11 DIAGNOSIS — N2889 Other specified disorders of kidney and ureter: Secondary | ICD-10-CM | POA: Insufficient documentation

## 2017-08-02 DIAGNOSIS — Z01818 Encounter for other preprocedural examination: Secondary | ICD-10-CM

## 2017-08-30 ENCOUNTER — Ambulatory Visit (HOSPITAL_COMMUNITY)
Admission: RE | Admit: 2017-08-30 | Discharge: 2017-08-30 | Disposition: A | Discharge status: Home / Self Care | Payer: Commercial Managed Care - PPO | Source: Ambulatory Visit | Attending: Urology | Admitting: Urology

## 2017-08-30 ENCOUNTER — Other Ambulatory Visit: Payer: Self-pay | Admitting: Urology

## 2017-08-30 DIAGNOSIS — C642 Malignant neoplasm of left kidney, except renal pelvis: Secondary | ICD-10-CM | POA: Insufficient documentation

## 2018-08-14 ENCOUNTER — Ambulatory Visit (HOSPITAL_COMMUNITY)
Admission: RE | Admit: 2018-08-14 | Discharge: 2018-08-14 | Disposition: A | Discharge status: Home / Self Care | Payer: Commercial Managed Care - PPO | Source: Ambulatory Visit | Attending: Urology | Admitting: Urology

## 2018-08-14 ENCOUNTER — Other Ambulatory Visit (HOSPITAL_COMMUNITY): Payer: Self-pay | Admitting: Urology

## 2018-08-14 DIAGNOSIS — C642 Malignant neoplasm of left kidney, except renal pelvis: Secondary | ICD-10-CM | POA: Insufficient documentation

## 2019-09-01 ENCOUNTER — Other Ambulatory Visit: Payer: Self-pay

## 2019-09-01 ENCOUNTER — Other Ambulatory Visit (HOSPITAL_COMMUNITY): Payer: Self-pay | Admitting: Urology

## 2019-09-01 ENCOUNTER — Ambulatory Visit (HOSPITAL_COMMUNITY)
Admission: RE | Admit: 2019-09-01 | Discharge: 2019-09-01 | Disposition: A | Discharge status: Home / Self Care | Payer: Medicare Other | Source: Ambulatory Visit | Attending: Urology | Admitting: Urology

## 2019-09-01 DIAGNOSIS — C642 Malignant neoplasm of left kidney, except renal pelvis: Secondary | ICD-10-CM | POA: Insufficient documentation

## 2020-09-02 ENCOUNTER — Other Ambulatory Visit (HOSPITAL_COMMUNITY): Payer: Self-pay | Admitting: Urology

## 2020-09-02 ENCOUNTER — Ambulatory Visit (HOSPITAL_COMMUNITY)
Admission: RE | Admit: 2020-09-02 | Discharge: 2020-09-02 | Disposition: A | Discharge status: Home / Self Care | Payer: Medicare Other | Source: Ambulatory Visit | Attending: Urology | Admitting: Urology

## 2020-09-02 ENCOUNTER — Other Ambulatory Visit: Payer: Self-pay

## 2020-09-02 DIAGNOSIS — C642 Malignant neoplasm of left kidney, except renal pelvis: Secondary | ICD-10-CM

## 2020-10-01 IMAGING — DX DG CHEST 2V
2 series · 2 of 2 positions shown · non-contrast
Comparison: Chest radiograph 08/14/2018

CLINICAL DATA: Renal cell carcinoma, left. Additional history
provided: History of left-sided renal cell CA, left kidney removed
4341, no recent chest complaints

EXAM:
CHEST - 2 VIEW

[chest pa]
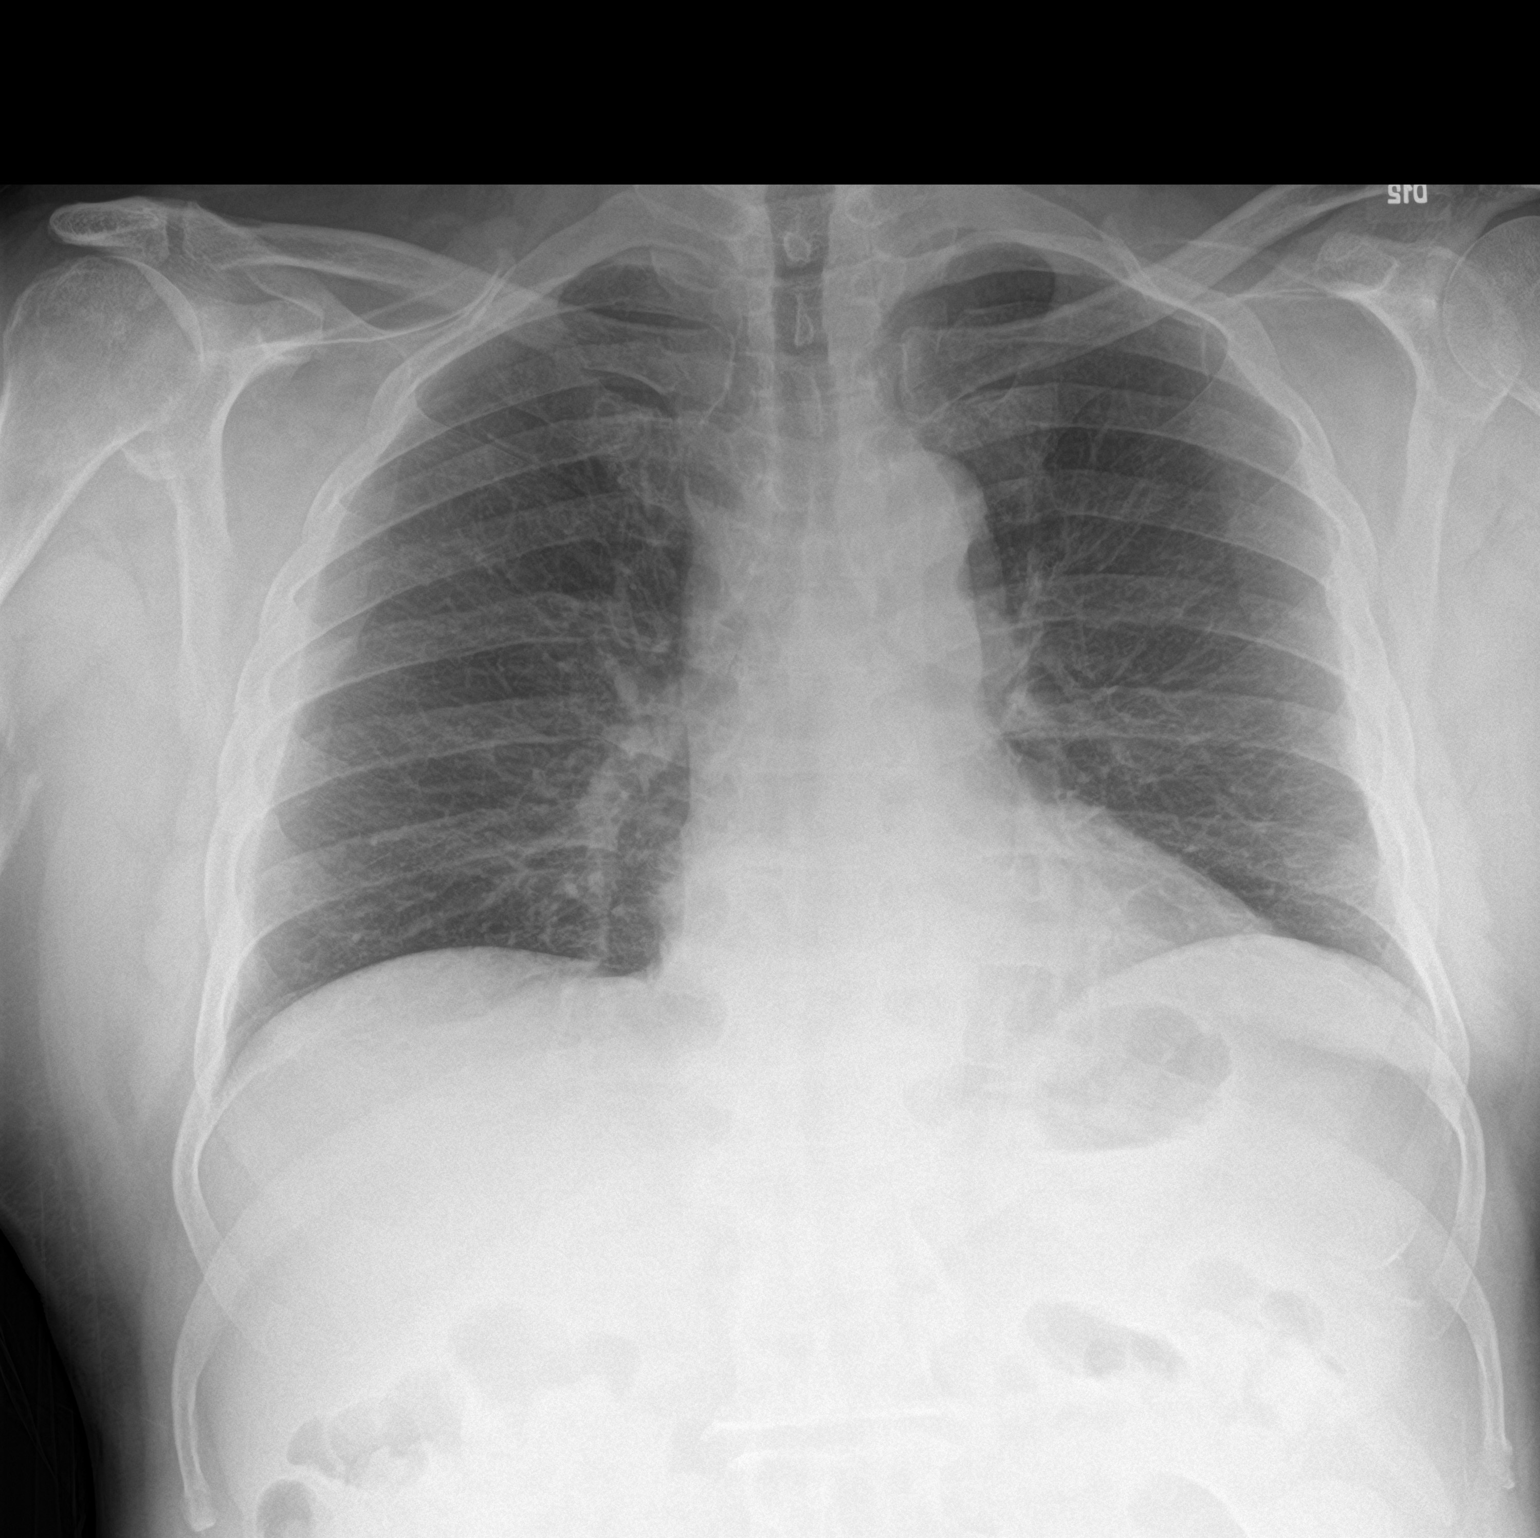

[chest lat]
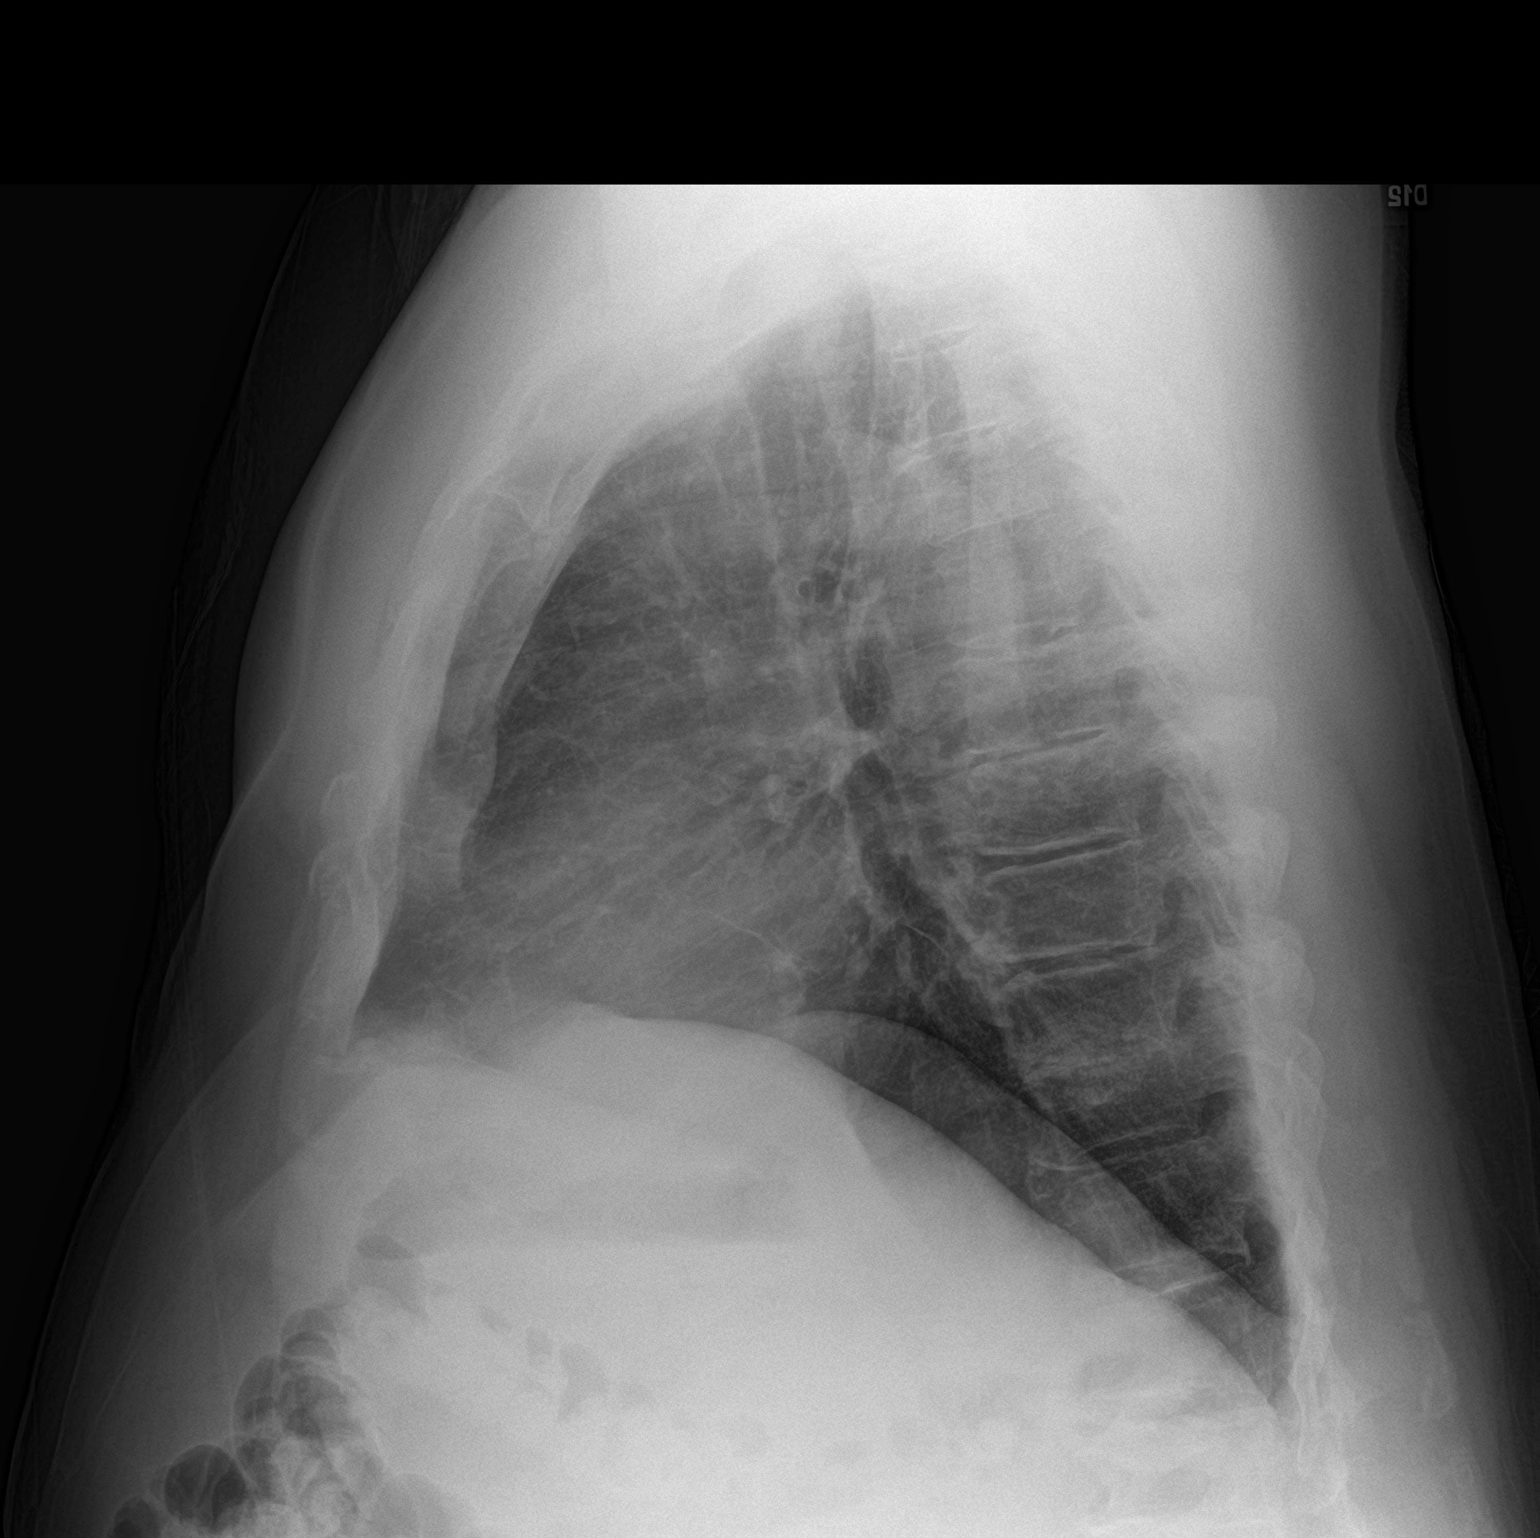

[2 of 2 positions shown; findings below may reference images not displayed]

FINDINGS: Heart size within normal limits. No evidence of airspace
consolidation within the lungs. No evidence of pleural effusion or
pneumothorax. No acute bony abnormality is identified. Chronic
healed left-sided rib fracture deformities. Thoracic spondylosis.
IMPRESSION: No evidence of acute cardiopulmonary abnormality.

## 2021-10-03 IMAGING — DX DG CHEST 2V
2 series · 2 of 2 positions shown · non-contrast
Comparison: September 01, 2019

CLINICAL DATA: Renal cell carcinoma removed 5 years ago.

EXAM:
CHEST - 2 VIEW

[chest pa]
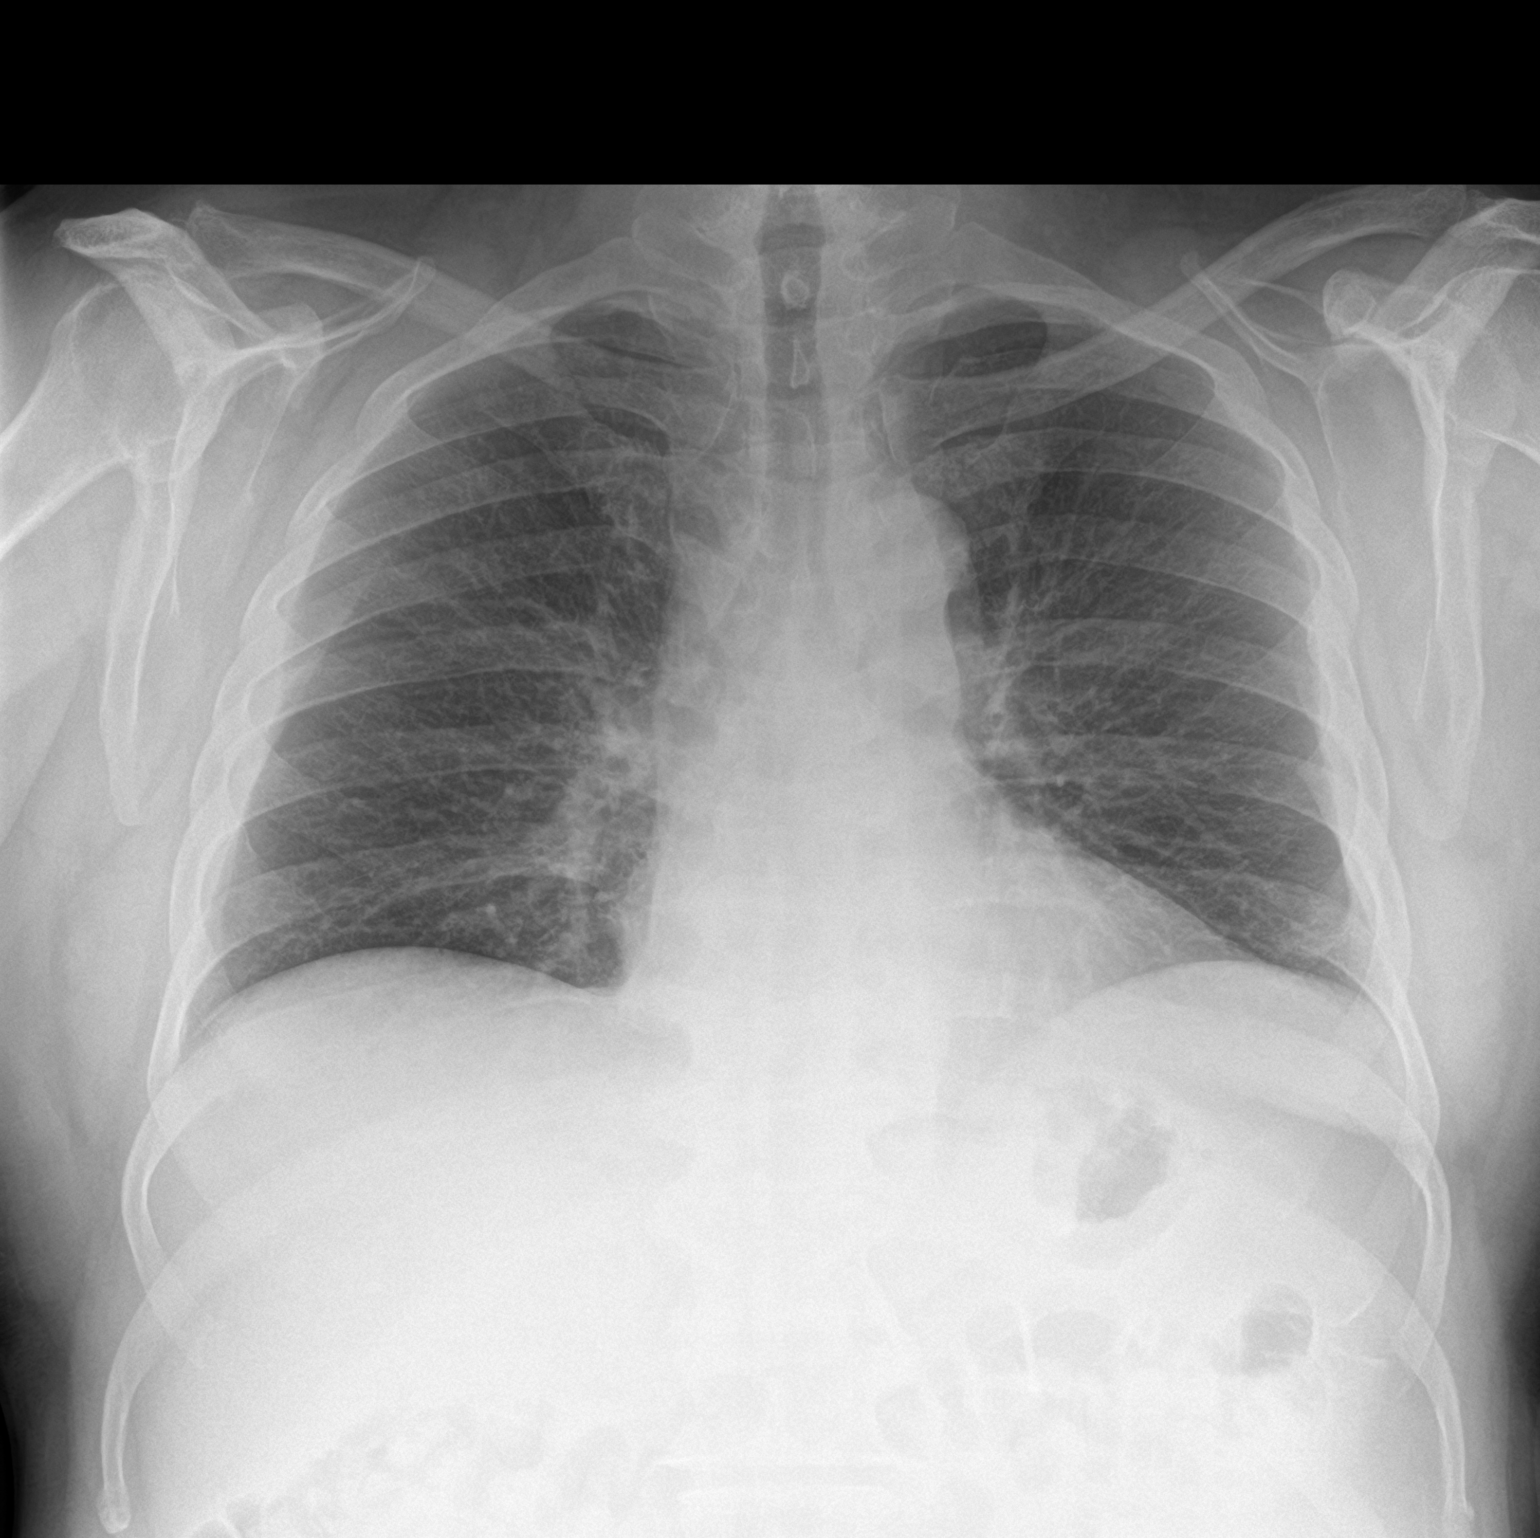

[chest lat]
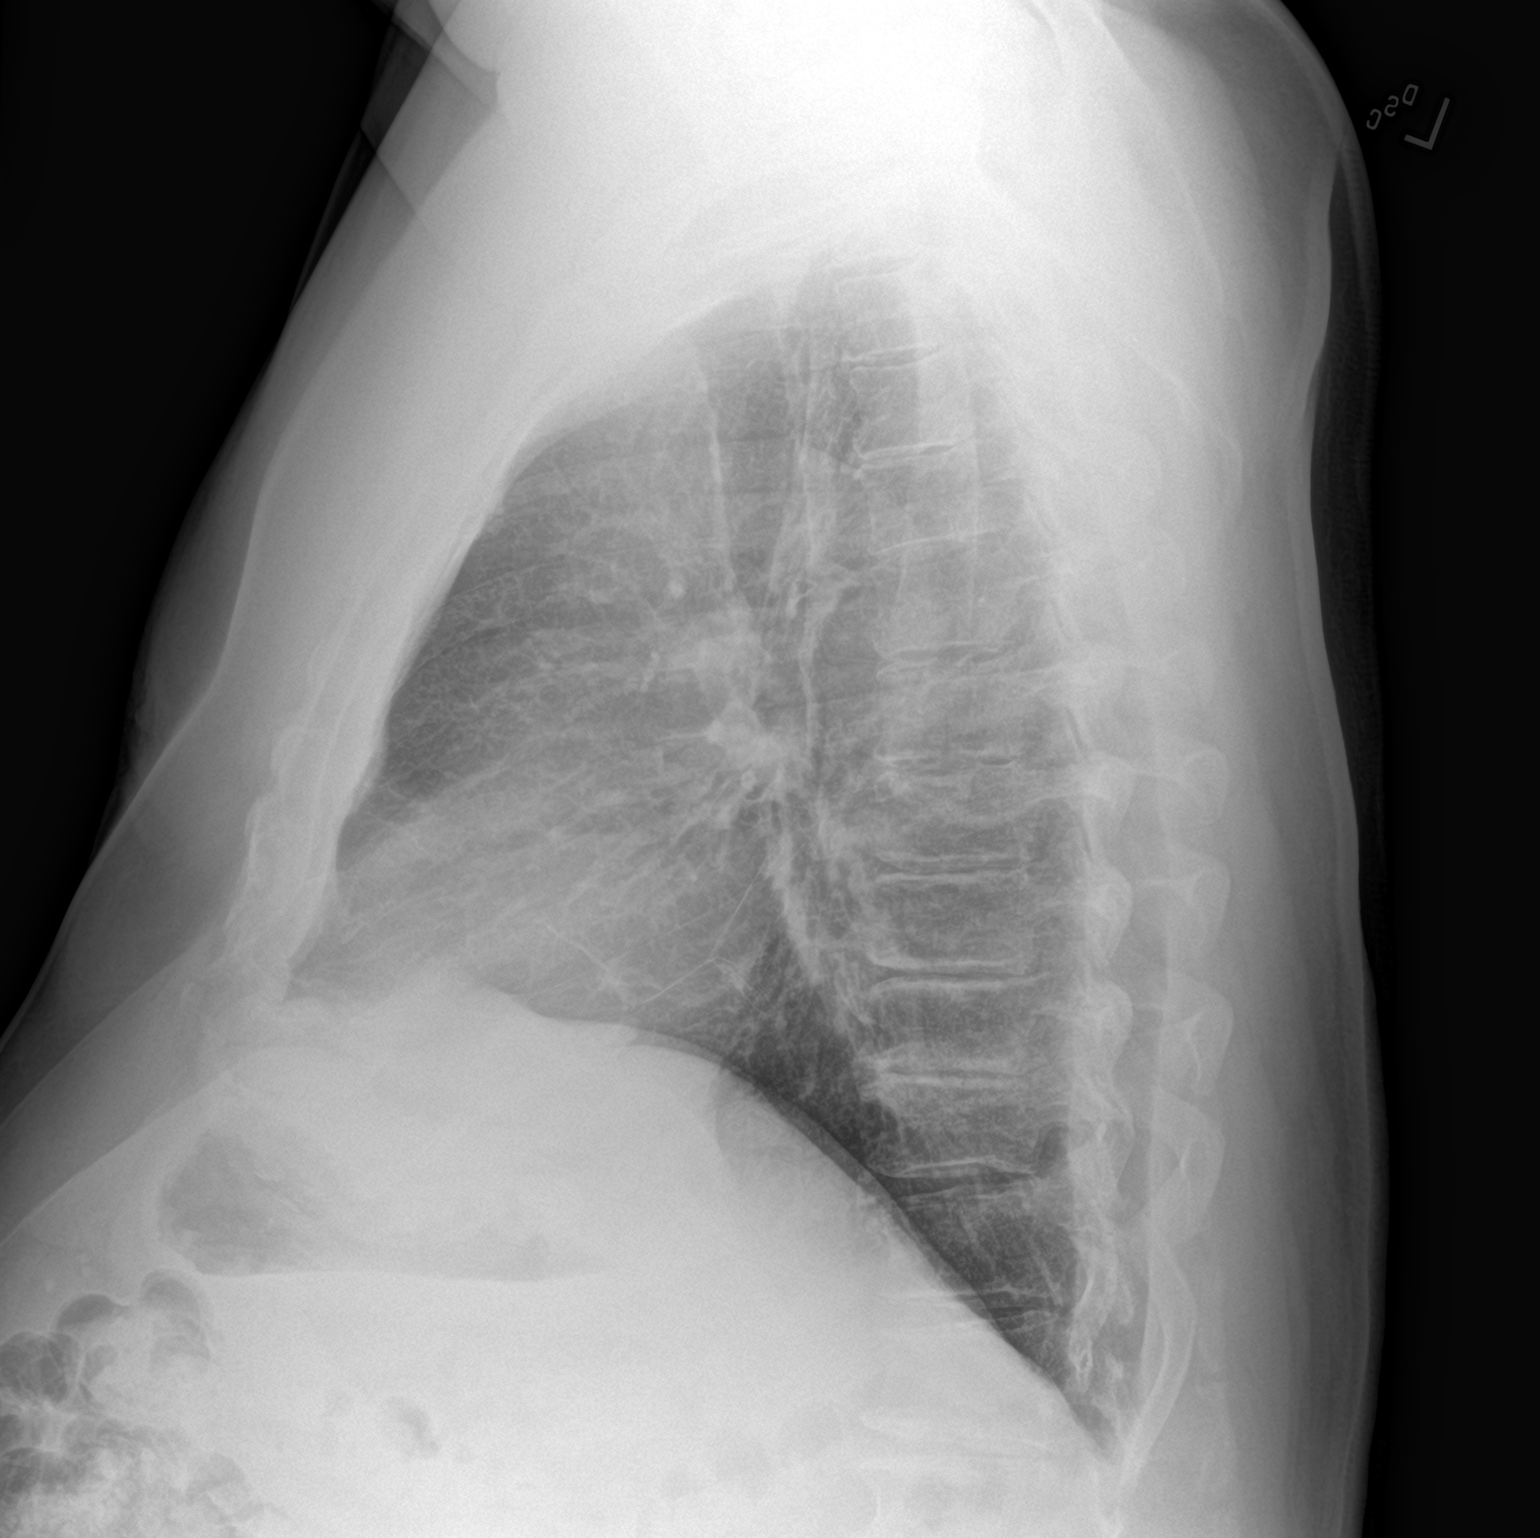

[2 of 2 positions shown; findings below may reference images not displayed]

FINDINGS: The heart size and mediastinal contours are within normal limits.
Both lungs are clear. The visualized skeletal structures are
unremarkable.
IMPRESSION: No active cardiopulmonary disease.
